# Patient Record
Sex: Male | Born: 1990 | Hispanic: Yes | Marital: Single
Health system: Southern US, Community
[De-identification: ages and names within clinical notes are randomized; demographics above are authoritative.]

## PROBLEM LIST (undated history)

## (undated) HISTORY — PX: APPENDECTOMY: SHX54

---

## 2019-11-28 ENCOUNTER — Other Ambulatory Visit: Payer: Self-pay

## 2019-11-28 ENCOUNTER — Emergency Department (HOSPITAL_COMMUNITY)
Admission: EM | Admit: 2019-11-28 | Discharge: 2019-11-28 | Disposition: A | Discharge status: Home / Self Care | Payer: Self-pay | Attending: Emergency Medicine | Admitting: Emergency Medicine

## 2019-11-28 ENCOUNTER — Encounter (HOSPITAL_COMMUNITY): Payer: Self-pay | Admitting: Emergency Medicine

## 2019-11-28 DIAGNOSIS — R197 Diarrhea, unspecified: Secondary | ICD-10-CM | POA: Insufficient documentation

## 2019-11-28 DIAGNOSIS — R1033 Periumbilical pain: Secondary | ICD-10-CM | POA: Insufficient documentation

## 2019-11-28 DIAGNOSIS — R10816 Epigastric abdominal tenderness: Secondary | ICD-10-CM | POA: Insufficient documentation

## 2019-11-28 LAB — COMPREHENSIVE METABOLIC PANEL
ALT: 21 U/L (ref 0–44)
AST: 20 U/L (ref 15–41)
Albumin: 4.4 g/dL (ref 3.5–5.0)
Alkaline Phosphatase: 87 U/L (ref 38–126)
Anion gap: 11 (ref 5–15)
BUN: 7 mg/dL (ref 6–20)
CO2: 24 mmol/L (ref 22–32)
Calcium: 9.7 mg/dL (ref 8.9–10.3)
Chloride: 103 mmol/L (ref 98–111)
Creatinine, Ser: 1.1 mg/dL (ref 0.61–1.24)
GFR calc Af Amer: >60 mL/min (ref >60)
GFR calc non Af Amer: >60 mL/min (ref >60)
Glucose, Bld: 108 mg/dL — ABNORMAL HIGH (ref 70–99)
Potassium: 4.1 mmol/L (ref 3.5–5.1)
Sodium: 138 mmol/L (ref 135–145)
Total Bilirubin: 0.8 mg/dL (ref 0.3–1.2)
Total Protein: 7.6 g/dL (ref 6.5–8.1)

## 2019-11-28 LAB — URINALYSIS, ROUTINE W REFLEX MICROSCOPIC
Bacteria, UA: NONE SEEN
Bilirubin Urine: NEGATIVE
Glucose, UA: NEGATIVE mg/dL
Hgb urine dipstick: NEGATIVE
Ketones, ur: NEGATIVE mg/dL
Leukocytes,Ua: NEGATIVE
Nitrite: NEGATIVE
Protein, ur: 30 mg/dL — AB
Specific Gravity, Urine: 1.016 (ref 1.005–1.030)
pH: 5 (ref 5.0–8.0)

## 2019-11-28 LAB — LIPASE, BLOOD: Lipase: 18 U/L (ref 11–51)

## 2019-11-28 LAB — CBC
HCT: 46.3 % (ref 39.0–52.0)
Hemoglobin: 15.7 g/dL (ref 13.0–17.0)
MCH: 28 pg (ref 26.0–34.0)
MCHC: 33.9 g/dL (ref 30.0–36.0)
MCV: 82.5 fL (ref 80.0–100.0)
Platelets: 272 10*3/uL (ref 150–400)
RBC: 5.61 MIL/uL (ref 4.22–5.81)
RDW: 12.3 % (ref 11.5–15.5)
WBC: 11.6 10*3/uL — ABNORMAL HIGH (ref 4.0–10.5)
nRBC: 0 % (ref 0.0–0.2)

## 2019-11-28 MED ORDER — ALUM & MAG HYDROXIDE-SIMETH 200-200-20 MG/5ML PO SUSP
30.0000 mL | Freq: Once | ORAL | Status: AC
  Administered 2019-11-28: 16:00:00 30 mL via ORAL
  Filled 2019-11-28: qty 30

## 2019-11-28 NOTE — ED Triage Notes (Signed)
Pt endorses mid abd pain that radiates to right side for 2 days. States it is sharp and has had some diarrhea.

## 2019-11-28 NOTE — ED Provider Notes (Signed)
[facility] EMERGENCY DEPARTMENT Provider Note   CSN: [id number] Arrival date & time: 11/28/19  1245     History Chief Complaint  Patient presents with  . Abdominal Pain    Leon Chapman is a 29 y.o. male who presents with abdominal pain. Pt states that he ate some [place] food and then a [employer] baconator 2 days ago and afterwards started to have sharp intermittent abdominal pain with associated diarrhea. Symptoms were bad the first day and are slowly improving but still persistent. The pain comes when he has to have diarrhea and gets better after he has a BM. He reports a subjective fever yesterday but not today. He denies N/V but has had some acid reflux. He denies blood in the stool. He has been drinking "electrolytes" and feels well hydrated. Although symptoms are improving he states he wanted to get checked out because last time he felt this way he had to have an appendectomy 2 years ago. He denies any other abdominal surgeries.  HPI     History reviewed. No pertinent past medical history.  There are no problems to display for this patient.   Past Surgical History:  Procedure Laterality Date  . APPENDECTOMY         No family history on file.  Social History   Tobacco Use  . Smoking status: Not on file  Substance Use Topics  . Alcohol use: Not on file  . Drug use: Not on file    Home Medications Prior to Admission medications   Not on File    Allergies    Patient has no allergy information on record.  Review of Systems   Review of Systems  Constitutional: Positive for fever.  Respiratory: Negative for shortness of breath.   Cardiovascular: Negative for chest pain.  Gastrointestinal: Positive for abdominal pain and diarrhea. Negative for nausea and vomiting.  Genitourinary: Negative for dysuria.  All other systems reviewed and are negative.   Physical Exam Updated Vital Signs BP 127/72 (BP Location: Left Arm)   Pulse 67   Temp 98.7  F (37.1 C) (Oral)   Resp (!) 21   Ht 5\' 9"  (1.753 m)   Wt 108.9 kg   SpO2 99%   BMI 35.44 kg/m   Physical Exam Vitals and nursing note reviewed.  Constitutional:      General: He is not in acute distress.    Appearance: He is well-developed. He is not ill-appearing.     Comments: Well appearing. NAD  HENT:     Head: Normocephalic and atraumatic.  Eyes:     General: No scleral icterus.       Right eye: No discharge.        Left eye: No discharge.     Conjunctiva/sclera: Conjunctivae normal.     Pupils: Pupils are equal, round, and reactive to light.  Cardiovascular:     Rate and Rhythm: Normal rate and regular rhythm.  Pulmonary:     Effort: Pulmonary effort is normal. No respiratory distress.     Breath sounds: Normal breath sounds.  Abdominal:     General: Abdomen is protuberant. Bowel sounds are normal. There is no distension.     Palpations: Abdomen is soft.     Tenderness: There is abdominal tenderness (mild) in the epigastric area, periumbilical area and suprapubic area. There is no guarding.  Musculoskeletal:     Cervical back: Normal range of motion.  Skin:    General: Skin is warm and  dry.  Neurological:     Mental Status: He is alert and oriented to person, place, and time.  Psychiatric:        Behavior: Behavior normal.     ED Results / Procedures / Treatments   Labs (all labs ordered are listed, but only abnormal results are displayed) Labs Reviewed  COMPREHENSIVE METABOLIC PANEL - Abnormal; Notable for the following components:      Result Value   Glucose, Bld 108 (*)    All other components within normal limits  CBC - Abnormal; Notable for the following components:   WBC 11.6 (*)    All other components within normal limits  URINALYSIS, ROUTINE W REFLEX MICROSCOPIC - Abnormal; Notable for the following components:   Protein, ur 30 (*)    All other components within normal limits  LIPASE, BLOOD    EKG None  Radiology No results  found.  Procedures Procedures (including critical care time)  Medications Ordered in ED Medications  alum & mag hydroxide-simeth (MAALOX/MYLANTA) 200-200-20 MG/5ML suspension 30 mL (30 mLs Oral Given 11/28/19 1554)    ED Course  I have reviewed the triage vital signs and the nursing notes.  Pertinent labs & imaging results that were available during my care of the patient were reviewed by me and considered in my medical decision making (see chart for details).  29 year old who presents with abdominal pain and diarrhea for 2 days after questionable food intake. Vitals are reassuring. Heart is regular rate and rhythm. Lungs are CTA. Abdomen is soft and minimally tender in the epigastric, periumbilical, and suprapubic region. He is s/p appendectomy. DDx: bacterial vs viral GI illness, gastritis, IBS/IBD. Less likely diverticulitis due to young age and pain in various locations. He reports symptoms have been improving but was concerned because last time he had to have surgery. Labs are overall reassuring. He has a mild leukocytosis (11). No signs of significant dehydration or electrolyte imbalance. Will give Gi cocktail and reassess.  Pt is feeling better. I don't think we need CT imaging today. He was given reassurance and advised to return if worsening. Work note given.  MDM Rules/Calculators/A&P                      Final Clinical Impression(s) / ED Diagnoses Final diagnoses:  Periumbilical abdominal pain  Diarrhea, unspecified type    Rx / DC Orders ED Discharge Orders    None       Recardo Evangelist, PA-C 11/28/19 1732    Carmin Muskrat, MD 12/01/19 2023

## 2019-11-28 NOTE — ED Notes (Signed)
Patient verbalizes understanding of discharge instructions. Opportunity for questioning and answers were provided. Armband removed by staff, pt discharged from ED ambulatory to home.  

## 2019-11-28 NOTE — Discharge Instructions (Signed)
Take Maalox or Mylanta or Pepto bismol for stomach upset. Symptoms should resolved in the next day or two Continue a bland diet (clear liquids, no spicy or fatty foods or alcohol) Please return if you are worsening

## 2020-03-29 ENCOUNTER — Other Ambulatory Visit: Payer: Self-pay

## 2020-03-29 ENCOUNTER — Emergency Department (HOSPITAL_COMMUNITY)
Admission: EM | Admit: 2020-03-29 | Discharge: 2020-03-30 | Disposition: A | Discharge status: Home / Self Care | Payer: No Typology Code available for payment source | Attending: Emergency Medicine | Admitting: Emergency Medicine

## 2020-03-29 ENCOUNTER — Encounter (HOSPITAL_COMMUNITY): Payer: Self-pay | Admitting: *Deleted

## 2020-03-29 DIAGNOSIS — Y9389 Activity, other specified: Secondary | ICD-10-CM | POA: Diagnosis not present

## 2020-03-29 DIAGNOSIS — R0781 Pleurodynia: Secondary | ICD-10-CM | POA: Diagnosis not present

## 2020-03-29 DIAGNOSIS — S6992XA Unspecified injury of left wrist, hand and finger(s), initial encounter: Secondary | ICD-10-CM | POA: Diagnosis present

## 2020-03-29 DIAGNOSIS — Y92410 Unspecified street and highway as the place of occurrence of the external cause: Secondary | ICD-10-CM | POA: Insufficient documentation

## 2020-03-29 DIAGNOSIS — S6292XA Unspecified fracture of left wrist and hand, initial encounter for closed fracture: Secondary | ICD-10-CM | POA: Insufficient documentation

## 2020-03-29 DIAGNOSIS — Y998 Other external cause status: Secondary | ICD-10-CM | POA: Diagnosis not present
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
## 2020-03-30 ENCOUNTER — Emergency Department (HOSPITAL_COMMUNITY): Payer: No Typology Code available for payment source

## 2020-03-30 MED ORDER — IBUPROFEN 800 MG PO TABS: 800.0000 mg | ORAL_TABLET | Freq: Three times a day (TID) | ORAL | 0 refills | Status: DC

## 2020-03-30 MED ORDER — HYDROCODONE-ACETAMINOPHEN 5-325 MG PO TABS: 1.0000 | ORAL_TABLET | ORAL | 0 refills | Status: DC | PRN
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
## 2020-03-30 NOTE — Discharge Instructions (Signed)
Take the prescribed medication as directed.  Leave splint in place for now. Follow-up with Dr. Aundria Rud-- call for appt in the morning. Return to the ED for new or worsening symptoms.

## 2020-03-30 NOTE — Progress Notes (Signed)
Orthopedic Tech Progress Note Patient Details:  Leon Chapman 09/20/1991 [id number]  Ortho Devices Type of Ortho Device: Arm sling, Volar splint Ortho Device/Splint Location: lue Ortho Device/Splint Interventions: Ordered, Application, Adjustment   Post Interventions Patient Tolerated: Well Instructions Provided: Care of device, Adjustment of device   [name] Post 03/30/2020, 1:55 AM
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
## 2024-10-21 ENCOUNTER — Ambulatory Visit: Payer: Self-pay | Admitting: Family Medicine

## 2024-10-29 ENCOUNTER — Ambulatory Visit: Payer: Self-pay | Admitting: Medical

## 2024-10-29 VITALS — BP 138/80 | HR 78 | Ht 69.5 in | Wt 245.6 lb

## 2024-10-29 DIAGNOSIS — K219 Gastro-esophageal reflux disease without esophagitis: Secondary | ICD-10-CM | POA: Insufficient documentation

## 2024-10-29 DIAGNOSIS — R0683 Snoring: Secondary | ICD-10-CM | POA: Insufficient documentation

## 2024-10-29 DIAGNOSIS — R030 Elevated blood-pressure reading, without diagnosis of hypertension: Secondary | ICD-10-CM | POA: Insufficient documentation

## 2024-10-29 DIAGNOSIS — G478 Other sleep disorders: Secondary | ICD-10-CM | POA: Insufficient documentation

## 2024-10-29 DIAGNOSIS — J351 Hypertrophy of tonsils: Secondary | ICD-10-CM | POA: Insufficient documentation

## 2024-10-29 DIAGNOSIS — R011 Cardiac murmur, unspecified: Secondary | ICD-10-CM | POA: Insufficient documentation

## 2024-10-29 DIAGNOSIS — Z820 Family history of epilepsy and other diseases of the nervous system: Secondary | ICD-10-CM | POA: Insufficient documentation

## 2024-10-29 MED ORDER — OMEPRAZOLE 40 MG PO CPDR: 40.0000 mg | DELAYED_RELEASE_CAPSULE | Freq: Every day | ORAL | 0 refills | Status: AC

## 2024-10-29 NOTE — Progress Notes (Signed)
 "  Name: Leon Chapman   Date of Visit: 10/29/24   Date of last visit with me: Visit date not found   CHIEF COMPLAINT:  Chief Complaint  Patient presents with   New Patient (Initial Visit)    New pt get established, snoring and gasping for air when sleeping. It will wake him up out of his sleep       HPI:  Discussed the use of AI scribe software for clinical note transcription with the patient, who gave verbal consent to proceed.  History of Present Illness   Leon Chapman is a 34 year old male who presents with concerns about sleep apnea and acid reflux.  Here with wife and daughter  He has concerns about his sleep, specifically loud snoring and episodes of apnea, which have been noted by his roommates and family. He describes waking suddenly and needing to stand up, sometimes feeling a sensation in his throat that he tries to clear. His father has been diagnosed with sleep apnea and uses a CPAP machine, which has prompted him to seek evaluation for similar symptoms. No excessive daytime sleepiness is reported.  He experiences acid reflux, which is influenced by his diet. Certain foods, such as spicy and citrus-based items, exacerbate his symptoms. He has been using Tums for relief but has not tried other medications like Prilosec. He is aware of dietary triggers and the impact of meal timing on his reflux.  He has a history of a heart murmur since childhood, but he has not had a recent evaluation or ultrasound of his heart. He recalls possibly having an ultrasound around the time of his appendix removal, but details are unclear.  He does not smoke and consumes alcohol moderately. 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 He has no known allergies and is not currently taking any medications  No other aggravating or relieving factors. No other complaint.  No past medical history on file.  No current outpatient medications on file prior to visit.   No  current facility-administered medications on file prior to visit.     ROS as in subjective  Objective: BP 138/80   Pulse 78   Ht 5' 9.5 (1.765 m)   Wt 245 lb 9.6 oz (111.4 kg)   SpO2 98%   BMI 35.75 kg/m   General appearence: alert, no distress, WD/WN,  HEENT: normocephalic, sclerae anicteric, TMs pearly, nares patent, no discharge or erythema, pharynx normal Oral cavity: MMM, large bilat tonsils, no lesions Neck: supple, no lymphadenopathy, no thyromegaly, no masses Heart: 2/6 holosystolic murmur throughout, otherwise RRR, normal S1, S2 Lungs: CTA bilaterally, no wheezes, rhonchi, or rales Abdomen: +bs, soft, non tender, non distended, no masses, no hepatomegaly, no splenomegaly Pulses: 2+ symmetric, upper and lower extremities, normal cap refill Ext: no edema    Assessment: Encounter Diagnoses  Name Primary?   Snoring Yes   Non-restorative sleep    Elevated blood pressure reading in office without diagnosis of hypertension    Family history of sleep apnea    Gastroesophageal reflux disease, unspecified whether esophagitis present    Heart murmur    Large tonsils      Plan:  Suspected obstructive sleep apnea Symptoms and family history suggest sleep apnea. Differential includes other sleep disorders. Home sleep study recommended. Discussed treatment options and emphasized weight loss. - Referred to [facility] for home sleep study. - Advised weight loss and positional therapy. - Discussed potential CPAP or BiPAP treatment if sleep apnea  is confirmed. - Discussed oral airway devices and Inspire procedure as alternative treatments.  Gastroesophageal reflux disease Symptoms suggest reflux. Discussed dietary modifications and omeprazole . Emphasized weight loss. - Prescribed omeprazole  30-40 minutes before breakfast. - Advised dietary modifications to avoid reflux triggers. - Continue use of Tums as needed.  Elevated blood pressure reading Borderline elevated  reading. No hypertension history. Discussed lifestyle modifications. - Advised lifestyle modifications including diet and exercise.  Cardiac murmur Known since childhood. No recent echocardiogram. Discussed potential need for evaluation. - Advised contacting insurance for echocardiogram coverage. - Will consider echocardiogram to evaluate cardiac murmur.   Heather was seen today for new patient (initial visit).  Diagnoses and all orders for this visit:  Snoring  Non-restorative sleep  Elevated blood pressure reading in office without diagnosis of hypertension  Family history of sleep apnea  Gastroesophageal reflux disease, unspecified whether esophagitis present  Heart murmur  Large tonsils  Other orders -     omeprazole  (PRILOSEC) 40 MG capsule; Take 1 capsule (40 mg total) by mouth daily.     F/u soon for fasting well visit  "

## 2024-10-29 NOTE — Progress Notes (Signed)
 Faxed over order to [facility]

## 2024-12-01 ENCOUNTER — Encounter: Payer: Self-pay | Admitting: Medical
